# Patient Record
Sex: Male | Born: 1965 | Hispanic: Yes | Marital: Married | Smoking: Never smoker
Health system: Southern US, Community
[De-identification: ages and names within clinical notes are randomized; demographics above are authoritative.]

## PROBLEM LIST (undated history)

## (undated) DIAGNOSIS — R079 Chest pain, unspecified: Secondary | ICD-10-CM

## (undated) DIAGNOSIS — E785 Hyperlipidemia, unspecified: Secondary | ICD-10-CM

## (undated) HISTORY — DX: Hyperlipidemia, unspecified: E78.5

## (undated) HISTORY — DX: Chest pain, unspecified: R07.9

---

## 2019-04-14 ENCOUNTER — Emergency Department (HOSPITAL_BASED_OUTPATIENT_CLINIC_OR_DEPARTMENT_OTHER): Payer: PRIVATE HEALTH INSURANCE

## 2019-04-14 ENCOUNTER — Other Ambulatory Visit: Payer: Self-pay

## 2019-04-14 ENCOUNTER — Encounter (HOSPITAL_BASED_OUTPATIENT_CLINIC_OR_DEPARTMENT_OTHER): Payer: Self-pay | Admitting: *Deleted

## 2019-04-14 ENCOUNTER — Emergency Department (HOSPITAL_BASED_OUTPATIENT_CLINIC_OR_DEPARTMENT_OTHER)
Admission: EM | Admit: 2019-04-14 | Discharge: 2019-04-15 | Disposition: A | Discharge status: Home / Self Care | Payer: PRIVATE HEALTH INSURANCE | Attending: Emergency Medicine | Admitting: Emergency Medicine

## 2019-04-14 DIAGNOSIS — W230XXA Caught, crushed, jammed, or pinched between moving objects, initial encounter: Secondary | ICD-10-CM | POA: Insufficient documentation

## 2019-04-14 DIAGNOSIS — Y9289 Other specified places as the place of occurrence of the external cause: Secondary | ICD-10-CM | POA: Insufficient documentation

## 2019-04-14 DIAGNOSIS — Y9389 Activity, other specified: Secondary | ICD-10-CM | POA: Insufficient documentation

## 2019-04-14 DIAGNOSIS — S61216A Laceration without foreign body of right little finger without damage to nail, initial encounter: Secondary | ICD-10-CM | POA: Diagnosis present

## 2019-04-14 DIAGNOSIS — Z23 Encounter for immunization: Secondary | ICD-10-CM | POA: Diagnosis not present

## 2019-04-14 DIAGNOSIS — Y990 Civilian activity done for income or pay: Secondary | ICD-10-CM | POA: Diagnosis not present

## 2019-04-14 MED ORDER — ACETAMINOPHEN 500 MG PO TABS
1000.0000 mg | ORAL_TABLET | Freq: Once | ORAL | Status: AC
  Administered 2019-04-15: 1000 mg via ORAL
  Filled 2019-04-14: qty 2

## 2019-04-14 MED ORDER — MORPHINE SULFATE (PF) 4 MG/ML IV SOLN
4.0000 mg | Freq: Once | INTRAVENOUS | Status: DC
  Filled 2019-04-14: qty 1

## 2019-04-14 MED ORDER — HYDROCODONE-ACETAMINOPHEN 5-325 MG PO TABS
1.0000 | ORAL_TABLET | Freq: Once | ORAL | Status: DC
  Filled 2019-04-14: qty 1

## 2019-04-14 MED ORDER — TETANUS-DIPHTH-ACELL PERTUSSIS 5-2.5-18.5 LF-MCG/0.5 IM SUSP
0.5000 mL | Freq: Once | INTRAMUSCULAR | Status: AC
  Administered 2019-04-14: 0.5 mL via INTRAMUSCULAR
  Filled 2019-04-14: qty 0.5

## 2019-04-14 NOTE — ED Triage Notes (Signed)
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 He does not speak english. Reports right pinkie caught in a machine at work earlier this evening. Pt able to move finger, multiple lacerations/avulsions present

## 2019-04-15 MED ORDER — CEPHALEXIN 250 MG PO CAPS: 250.0000 mg | ORAL_CAPSULE | Freq: Three times a day (TID) | ORAL | 0 refills | Status: AC

## 2019-04-15 MED ORDER — LIDOCAINE HCL 2 % IJ SOLN
10.0000 mL | Freq: Once | INTRAMUSCULAR | Status: AC
  Administered 2019-04-15: 200 mg
  Filled 2019-04-15: qty 20

## 2019-04-15 MED ORDER — DICYCLOMINE HCL 10 MG PO CAPS: 10.0000 mg | ORAL_CAPSULE | Freq: Once | ORAL | Status: DC

## 2019-04-15 MED ORDER — CEPHALEXIN 250 MG PO CAPS
250.0000 mg | ORAL_CAPSULE | Freq: Once | ORAL | Status: AC
  Administered 2019-04-15: 250 mg via ORAL
  Filled 2019-04-15: qty 1

## 2019-04-15 NOTE — ED Provider Notes (Signed)
[facility] EMERGENCY DEPARTMENT Provider Note  CSN: [id number] Arrival date & time: 04/14/19 2223  Chief Complaint(s) Finger Injury  HPI Mathew Harris is a 53 y.o. male with no pertinent past medical history presents to the emergency department with right small finger injury while at work.  He reports that his finger got caught in a vacuum fan while he was trying to turn it off.  Injury occurred 2 to 3 hours prior to arrival.  He sustained laceration to the dorsum and ventral part of the small finger.  He is endorsing pain around the this area.  Pain exacerbated with palpation.  Complains of mild numbness.  No alleviating factors.  Tetanus out of date.  Denies any other injuries.  The history is provided by the patient.    Past Medical History History reviewed. No pertinent past medical history. There are no active problems to display for this patient.  Home Medication(s) Prior to Admission medications   Medication Sig Start Date End Date Taking? Authorizing Provider  cephALEXin (KEFLEX) 250 MG capsule Take 1 capsule (250 mg total) by mouth 3 (three) times daily for 5 days. 04/15/19 04/20/19  Fatima Blank, MD                                                                                                                                    Past Surgical History History reviewed. No pertinent surgical history. Family History No family history on file.  Social History Social History   Tobacco Use  . Smoking status: Never Smoker  . Smokeless tobacco: Never Used  Substance Use Topics  . Alcohol use: Never    Frequency: Never  . Drug use: Never   Allergies Penicillins  Review of Systems Review of Systems All other systems are reviewed and are negative for acute change except as noted in the HPI  Physical Exam Vital Signs  I have reviewed the triage vital signs BP 128/72 (BP Location: Left Arm)   Pulse 60   Temp 98.1 F (36.7 C) (Oral)   Resp 16    Ht 5\' 5"  (1.651 m)   Wt 94.3 kg   SpO2 98%   BMI 34.60 kg/m   Physical Exam Vitals signs reviewed.  Constitutional:      General: He is not in acute distress.    Appearance: He is well-developed. He is not diaphoretic.  HENT:     Head: Normocephalic and atraumatic.     Jaw: No trismus.     Right Ear: External ear normal.     Left Ear: External ear normal.     Nose: Nose normal.  Eyes:     General: No scleral icterus.    Conjunctiva/sclera: Conjunctivae normal.  Neck:     Musculoskeletal: Normal range of motion.     Trachea: Phonation normal.  Cardiovascular:     Rate and Rhythm: Normal rate and regular rhythm.  Pulmonary:  Effort: Pulmonary effort is normal. No respiratory distress.     Breath sounds: No stridor.  Abdominal:     General: There is no distension.  Musculoskeletal: Normal range of motion.       Hands:  Neurological:     Mental Status: He is alert and oriented to person, place, and time.  Psychiatric:        Behavior: Behavior normal.     ED Results and Treatments Labs (all labs ordered are listed, but only abnormal results are displayed) Labs Reviewed - No data to display                                                                                                                       EKG  EKG Interpretation  Date/Time:    Ventricular Rate:    PR Interval:    QRS Duration:   QT Interval:    QTC Calculation:   R Axis:     Text Interpretation:        Radiology Dg Hand Complete Right  Result Date: 04/14/2019 CLINICAL DATA:  Initial evaluation for acute injury to fifth digit. EXAM: RIGHT HAND - COMPLETE 3+ VIEW COMPARISON:  None. FINDINGS: Soft tissue irregularity about the mid right fifth digit, compatible with soft tissue injury/laceration. No radiopaque foreign body. No acute fracture dislocation. Punctate radiopaque foreign body seen within the radial soft tissues adjacent to the right third PIP joint. No other abnormality about the  hand. IMPRESSION: 1. Soft tissue injury/laceration about the mid right fifth digit. No radiopaque foreign body. 2. No acute osseous abnormality. 3. Punctate radiopaque foreign body adjacent to the right third PIP joint. Correlation with physical exam recommended. Electronically Signed   By: Rise MuBenjamin  McClintock M.D.   On: 04/14/2019 23:46    Pertinent labs & imaging results that were available during my care of the patient were reviewed by me and considered in my medical decision making (see chart for details).  Medications Ordered in ED Medications  Tdap (BOOSTRIX) injection 0.5 mL (0.5 mLs Intramuscular Given 04/14/19 2339)  acetaminophen (TYLENOL) tablet 1,000 mg (1,000 mg Oral Given 04/15/19 0001)  lidocaine (XYLOCAINE) 2 % (with pres) injection 200 mg (200 mg Other Given 04/15/19 0018)  cephALEXin (KEFLEX) capsule 250 mg (250 mg Oral Given 04/15/19 0200)  Procedures .[name].Laceration Repair  Date/Time: 04/15/2019 3:16 AM Performed by: Nira Connardama, Pedro Eduardo, MD Authorized by: Nira Connardama, Pedro Eduardo, MD   Consent:    Consent obtained:  Verbal   Consent given by:  Patient   Risks discussed:  Poor cosmetic result, poor wound healing, vascular damage, nerve damage and tendon damage   Alternatives discussed:  Delayed treatment Anesthesia (see MAR for exact dosages):    Anesthesia method:  Local infiltration   Local anesthetic:  Lidocaine 2% w/o epi Laceration details:    Location:  Finger   Finger location:  R small finger   Length (cm):  2   Depth (mm):  3 Repair type:    Repair type:  Intermediate Pre-procedure details:    Preparation:  Patient was prepped and draped in usual sterile fashion Exploration:    Hemostasis achieved with:  Direct pressure   Wound exploration: wound explored through full range of motion and entire depth of wound probed and  visualized     Wound extent: tendon damage     Wound extent: no foreign bodies/material noted, no muscle damage noted and no vascular damage noted     Tendon damage location:  Upper extremity   Upper extremity tendon damage location:  Finger extensor   Finger extensor tendon: lateral band.   Tendon damage extent:  Partial transection   Tendon repair plan:  Refer for evaluation   Contaminated: no   Treatment:    Area cleansed with:  Betadine   Amount of cleaning:  Extensive   Irrigation solution:  Sterile saline   Irrigation volume:  750cc   Irrigation method:  Pressure wash   Visualized foreign bodies/material removed: no   Skin repair:    Repair method:  Sutures   Suture size:  4-0   Wound skin closure material used: ethilon.   Suture technique:  Simple interrupted   Number of sutures:  4 Approximation:    Approximation:  Close Post-procedure details:    Dressing:  Non-adherent dressing   Patient tolerance of procedure:  Tolerated well, no immediate complications    (including critical care time)  Medical Decision Making / ED Course I have reviewed the nursing notes for this encounter and the patient's prior records (if available in EHR or on provided paperwork).   Cohen de Lawanna Kobusngel was evaluated in Emergency Department on 04/15/2019 for the symptoms described in the history of present illness. He was evaluated in the context of the global COVID-19 pandemic, which necessitated consideration that the patient might be at risk for infection with the SARS-CoV-2 virus that causes COVID-19. Institutional protocols and algorithms that pertain to the evaluation of patients at risk for COVID-19 are in a state of rapid change based on information released by regulatory bodies including the CDC and federal and state organizations. These policies and algorithms were followed during the patient's care in the ED.  Right small finger injury Neurovascularly intact Possible lateral band partial  transection Plain film without foreign body or underlying fracture.  Foreign body noted in right middle finger but no wounds or injury related to this encounter.  Likely previously embedded. Lacerations thoroughly irrigated and dorsal laceration closed as above.  Ventral laceration/skin avulsion will be allowed to close by secondary measures. Patient was provided with hand surgery contact information for further evaluation regarding the lateral band transection. Return in 10 to 14 days for suture removal. Prophylactic antibiotics given. Tetanus updated.      Final Clinical Impression(s) / ED Diagnoses Final diagnoses:  Laceration of right little finger without foreign body without damage to nail, initial encounter    The patient appears reasonably screened and/or stabilized for discharge and I doubt any other medical condition or other [facility] requiring further screening, evaluation, or treatment in the ED at this time prior to discharge.  Disposition: Discharge  Condition: Good  I have discussed the results, Dx and Tx plan with the patient who expressed understanding and agree(s) with the plan. Discharge instructions discussed at great length. The patient was given strict return precautions who verbalized understanding of the instructions. No further questions at time of discharge.    ED Discharge Orders         Ordered    cephALEXin (KEFLEX) 250 MG capsule  3 times daily     04/15/19 0151           [facility]: [facility] [address] [id number] mc [address] [id number] [phone]  For suture removal in 10-14 days  Bradly BienenstockOrtmann, Fred, MD [address] [place] [id number] [phone]   For close follow up to assess for if you cannot raise the end of your small finger as this may be related to a tendon injury  [facility] [address] [place]  [phone] Schedule an appointment as soon as possible for a visit  with Occupational Therapy for worker's compensation related injuries.      This chart was dictated using voice recognition software.  Despite best efforts to proofread,  errors can occur which can change the documentation meaning.   Nira Connardama, Pedro Eduardo, MD 04/15/19 [phone]

## 2020-07-15 ENCOUNTER — Ambulatory Visit: Payer: Self-pay | Admitting: Internal Medicine

## 2020-07-23 ENCOUNTER — Encounter: Payer: Self-pay | Admitting: Internal Medicine

## 2020-07-23 ENCOUNTER — Other Ambulatory Visit: Payer: Self-pay

## 2020-07-23 ENCOUNTER — Ambulatory Visit (INDEPENDENT_AMBULATORY_CARE_PROVIDER_SITE_OTHER): Payer: Self-pay

## 2020-07-23 ENCOUNTER — Ambulatory Visit (INDEPENDENT_AMBULATORY_CARE_PROVIDER_SITE_OTHER): Payer: Self-pay | Admitting: Internal Medicine

## 2020-07-23 VITALS — BP 110/70 | HR 61 | Temp 98.4°F | Ht 66.0 in | Wt 204.4 lb

## 2020-07-23 DIAGNOSIS — R1011 Right upper quadrant pain: Secondary | ICD-10-CM

## 2020-07-23 DIAGNOSIS — R079 Chest pain, unspecified: Secondary | ICD-10-CM

## 2020-07-23 DIAGNOSIS — R0602 Shortness of breath: Secondary | ICD-10-CM

## 2020-07-23 DIAGNOSIS — E785 Hyperlipidemia, unspecified: Secondary | ICD-10-CM | POA: Insufficient documentation

## 2020-07-23 DIAGNOSIS — E669 Obesity, unspecified: Secondary | ICD-10-CM

## 2020-07-23 NOTE — Progress Notes (Signed)
New Patient Office Visit     This visit occurred during the SARS-CoV-2 public health emergency.  Safety protocols were in place, including screening questions prior to the visit, additional usage of staff PPE, and extensive cleaning of exam room while observing appropriate contact time as indicated for disinfecting solutions.    CC/Reason for Visit: Establish care, discuss acute and chronic concerns Previous PCP: None Last Visit: Unknown  HPI: Mathew Harris is a 54 y.o. male who is coming in today for the above mentioned reasons.  He has not had medical care in years.  He does tell me that at some point he was diagnosed with hyperlipidemia and hypertriglyceridemia but he never followed the treatment.  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  No records of this are evident in epic.  He has been managing this with diet.  He notices that fatty foods and sometimes spicy foods will make it worse.  He was diagnosed with COVID-19 virus infection in December.  Ever since then he has noticed some shortness of breath that has gotten progressively worse.  He sometimes has a "smoke smell" in his nose.  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  He does not feel like this shortness of breath is worse with the work he has had to do on his land.  Over the past 6 weeks or so he has noticed occasional left-sided chest pain right under his nipple.  It is not related to exertion.  It is very sharp.  It lasts for few seconds to maybe 1 to 2 minutes and then resolves.  It is not associated to food.  Does not improve with rest.    Past Medical/Surgical History: Past Medical History:  Diagnosis Date  . Hyperlipidemia     History reviewed. No pertinent surgical history.  Social History:  reports that he has never smoked. He has never used smokeless tobacco. He reports that he does not  drink alcohol and does not use drugs.  Allergies: Allergies  Allergen Reactions  . Penicillins Other (See Comments)    As a child    Family History:  Family History  Problem Relation Age of Onset  . Alzheimer's disease Mother   . Hyperlipidemia Mother   . CAD Paternal Grandfather   . CAD Paternal Uncle   . CAD Paternal Uncle     No current outpatient medications on file.  Review of Systems:  Constitutional: Denies fever, chills, diaphoresis, appetite change and fatigue.  HEENT: Denies photophobia, eye pain, redness, hearing loss, ear pain, congestion, sore throat, rhinorrhea, sneezing, mouth sores, trouble swallowing, neck pain, neck stiffness and tinnitus.   Respiratory: Denies cough, chest tightness,  and wheezing.   Cardiovascular: Denies  palpitations and leg swelling.  Gastrointestinal: Denies nausea, vomiting, abdominal pain, diarrhea, constipation, blood in stool and abdominal distention.  Genitourinary: Denies dysuria, urgency, frequency, hematuria, flank pain and difficulty urinating.  Endocrine: Denies: hot or cold intolerance, sweats, changes in hair or nails, polyuria, polydipsia. Musculoskeletal: Denies myalgias, back pain, joint swelling, arthralgias and gait problem.  Skin: Denies pallor, rash and wound.  Neurological: Denies dizziness, seizures, syncope, weakness, light-headedness, numbness and headaches.  Hematological: Denies adenopathy. Easy bruising, personal or family bleeding history  Psychiatric/Behavioral: Denies suicidal ideation, mood changes, confusion, nervousness, sleep disturbance and agitation    Physical Exam: Vitals:   07/23/20 1301  BP: 110/70  Pulse: 61  Temp: 98.4 F (36.9 C)  TempSrc: Oral  SpO2: 96%  Weight: 204 lb 6.4 oz (92.7 kg)  Height: 5\' 6"  (1.676 m)   Body mass index is 32.99 kg/m.  Constitutional: NAD, calm, comfortable Eyes: PERRL, lids and conjunctivae normal ENMT: Mucous membranes are moist Respiratory: clear to  auscultation bilaterally, no wheezing, no crackles. Normal respiratory effort. No accessory muscle use.  Cardiovascular: Regular rate and rhythm, no murmurs / rubs / gallops. No extremity edema.  Abdomen: no tenderness, no masses palpated. No hepatosplenomegaly. Bowel sounds positive.  Neurologic: Grossly intact and nonfocal Psychiatric: Normal judgment and insight. Alert and oriented x 3. Normal mood.    Impression and Plan:  Chest pain, unspecified type/shortness of breath -Most certainly this is atypical chest pain. -He does have some CAD risk factors including mild obesity, history of hyperlipidemia as well as a strong family history with paternal grandfather and multiple paternal uncles having had MIs and fatal MIs in their 49s.  He is a never smoker.  He is normotensive, he is not diagnosed as a diabetic. -I do however feel that he has enough risk factors to warrant some cardiac work-up.  EKG has been performed in office today that shows sinus bradycardia with a rate of 59, there appears to be repolarization changes, but no acute ST or T wave changes. -I will order labs, 2D echo and a cardiology consultation. -Since he has noticed the symptoms are progressive since his COVID-19 infection in December, I also wonder about pericarditis/myocarditis post Covid.  Although EKG is not indicative of pericarditis today. -Given his shortness of breath is his main symptom, I will go ahead and order a two-view chest x-ray today.  May need to consider CT scan of his lungs pending results.  Hyperlipidemia, unspecified hyperlipidemia type  - Plan: Lipid panel  Obesity (BMI 30.0-34.9) -Discussed healthy lifestyle, including increased physical activity and better food choices to promote weight loss.  RUQ pain -Consider right upper quadrant ultrasound pending completion of cardiology work-up and results of labs ordered today.     02-05-1993, MD [facility]

## 2020-07-24 ENCOUNTER — Other Ambulatory Visit: Payer: Self-pay | Admitting: Internal Medicine

## 2020-07-24 DIAGNOSIS — E785 Hyperlipidemia, unspecified: Secondary | ICD-10-CM

## 2020-07-24 LAB — HEMOGLOBIN A1C
Hgb A1c MFr Bld: 5.5 % of total Hgb (ref <5.7)
Mean Plasma Glucose: 111 (calc)
eAG (mmol/L): 6.2 (calc)

## 2020-07-24 LAB — CBC WITH DIFFERENTIAL/PLATELET
Absolute Monocytes: 403 cells/uL (ref 200–950)
Basophils Absolute: 38 cells/uL (ref 0–200)
Basophils Relative: 0.8 %
Eosinophils Absolute: 120 cells/uL (ref 15–500)
Eosinophils Relative: 2.5 %
HCT: 45 % (ref 38.5–50.0)
Hemoglobin: 14.8 g/dL (ref 13.2–17.1)
Lymphs Abs: 1344 cells/uL (ref 850–3900)
MCH: 30.1 pg (ref 27.0–33.0)
MCHC: 32.9 g/dL (ref 32.0–36.0)
MCV: 91.6 fL (ref 80.0–100.0)
MPV: 10.8 fL (ref 7.5–12.5)
Monocytes Relative: 8.4 %
Neutro Abs: 2894 cells/uL (ref 1500–7800)
Neutrophils Relative %: 60.3 %
Platelets: 216 10*3/uL (ref 140–400)
RBC: 4.91 10*6/uL (ref 4.20–5.80)
RDW: 12.2 % (ref 11.0–15.0)
Total Lymphocyte: 28 %
WBC: 4.8 10*3/uL (ref 3.8–10.8)

## 2020-07-24 LAB — COMPREHENSIVE METABOLIC PANEL
AG Ratio: 1.8 (calc) (ref 1.0–2.5)
ALT: 21 U/L (ref 9–46)
AST: 23 U/L (ref 10–35)
Albumin: 4.8 g/dL (ref 3.6–5.1)
Alkaline phosphatase (APISO): 78 U/L (ref 35–144)
BUN: 15 mg/dL (ref 7–25)
CO2: 30 mmol/L (ref 20–32)
Calcium: 9.3 mg/dL (ref 8.6–10.3)
Chloride: 103 mmol/L (ref 98–110)
Creat: 0.94 mg/dL (ref 0.70–1.33)
Globulin: 2.6 g/dL (calc) (ref 1.9–3.7)
Glucose, Bld: 88 mg/dL (ref 65–99)
Potassium: 4.5 mmol/L (ref 3.5–5.3)
Sodium: 142 mmol/L (ref 135–146)
Total Bilirubin: 0.5 mg/dL (ref 0.2–1.2)
Total Protein: 7.4 g/dL (ref 6.1–8.1)

## 2020-07-24 LAB — LIPID PANEL
Cholesterol: 242 mg/dL — ABNORMAL HIGH (ref <200)
HDL: 39 mg/dL — ABNORMAL LOW (ref >=40)
LDL Cholesterol (Calc): 163 mg/dL (calc) — ABNORMAL HIGH
Non-HDL Cholesterol (Calc): 203 mg/dL (calc) — ABNORMAL HIGH (ref <130)
Total CHOL/HDL Ratio: 6.2 (calc) — ABNORMAL HIGH (ref <5.0)
Triglycerides: 234 mg/dL — ABNORMAL HIGH (ref <150)

## 2020-07-24 MED ORDER — ATORVASTATIN CALCIUM 20 MG PO TABS: 20.0000 mg | ORAL_TABLET | Freq: Every day | ORAL | 1 refills | Status: DC

## 2020-07-25 ENCOUNTER — Other Ambulatory Visit: Payer: Self-pay | Admitting: Internal Medicine

## 2020-07-25 DIAGNOSIS — E785 Hyperlipidemia, unspecified: Secondary | ICD-10-CM

## 2020-08-18 NOTE — Progress Notes (Signed)
Cardiology Office Note:    Date:  08/20/2020   ID:  [id number] [place], Mathew Harris [birth year], MRN [id number]  PCP:  Philip Aspen, Limmie Patricia, MD  [facility] Cardiologist:  Donato Schultz, MD  [facility] Electrophysiologist:  None   Referring MD: Philip Aspen, Estel*     History of Present Illness:    Mathew Harris is a 55 y.o. male here for evaluation of chest pain at the request of Dr. Philip Aspen.  In review of her note from 07/23/2020 he was noticing shortness of breath with COVID-19 virus in December 2020, but now he feels great.  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  Purchased a farm.  Occasionally he will notice some left-sided chest pain/pressure, sharp like pain mid axillary line. Last episode 2 weeks ago, made him scream. Woke up and got it. Quick on and off.  Happens with sitting or laying. 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   Usually it is very sharp.  Last for a less than one second.  Food does not make it worse.  No change with rest.   Family history is strong for MI with his multiple paternal uncles having fatal MIs in their 51s.  Non-smoker.    Past Medical History:  Diagnosis Date  . Hyperlipidemia     No past surgical history on file.  Current Medications: Current Meds  Medication Sig  . atorvastatin (LIPITOR) 20 MG tablet Take 1 tablet (20 mg total) by mouth daily.     Allergies:   Penicillins   Social History   Socioeconomic History  . Marital status: Married    Spouse name: Not on file  . Number of children: Not on file  . Years of education: Not on file  . Highest education level: Not on file  Occupational History  . Not on file  Tobacco Use  . Smoking status: Never Smoker  . Smokeless tobacco: Never Used  Vaping Use  . Vaping Use: Never used  Substance and Sexual Activity  . Alcohol use: Never  . Drug use: Never  . Sexual activity: Not on file  Other Topics Concern  . Not on file  Social History Narrative  . Not on file    Social Determinants of Health   Financial Resource Strain:   . Difficulty of Paying Living Expenses: Not on file  Food Insecurity:   . Worried About Programme researcher, broadcasting/film/video in the Last Year: Not on file  . Ran Out of Food in the Last Year: Not on file  Transportation Needs:   . Lack of Transportation (Medical): Not on file  . Lack of Transportation (Non-Medical): Not on file  Physical Activity:   . Days of Exercise per Week: Not on file  . Minutes of Exercise per Session: Not on file  Stress:   . Feeling of Stress : Not on file  Social Connections:   . Frequency of Communication with Friends and Family: Not on file  . Frequency of Social Gatherings with Friends and Family: Not on file  . Attends Religious Services: Not on file  . Active Member of Clubs or Organizations: Not on file  . Attends Banker Meetings: Not on file  . Marital Status: Not on file     Family History: The patient's family history includes Alzheimer's disease in his mother; CAD in his paternal grandfather, paternal uncle, and paternal uncle; Hyperlipidemia in his mother.  ROS:   Please see the history of present illness.  No fevers chills nausea vomiting syncope bleeding all other systems reviewed and are negative.  EKGs/Labs/Other Studies Reviewed:    The following studies were reviewed today: EKG, chest x-ray personally reviewed shows no abnormalities.  No evidence of rib fracture.  EKG: Sinus rhythm 59 bpm no ischemic changes.  Recent Labs: 07/23/2020: ALT 21; BUN 15; Creat 0.94; Hemoglobin 14.8; Platelets 216; Potassium 4.5; Sodium 142  Recent Lipid Panel    Component Value Date/Time   CHOL 242 (H) 07/23/2020 1343   TRIG 234 (H) 07/23/2020 1343   HDL 39 (L) 07/23/2020 1343   CHOLHDL 6.2 (H) 07/23/2020 1343   LDLCALC 163 (H) 07/23/2020 1343     Risk Assessment/Calculations:       Physical Exam:    VS:  BP 112/70   Pulse 61   Ht 5\' 6"  (1.676 m)   Wt 206 lb (93.4 kg)    SpO2 97%   BMI 33.25 kg/m     Wt Readings from Last 3 Encounters:  08/20/20 206 lb (93.4 kg)  07/23/20 204 lb 6.4 oz (92.7 kg)  04/14/19 207 lb 14.3 oz (94.3 kg)     GEN:  Well nourished, well developed in no acute distress HEENT: Normal, left V1 nerve distribution port wine stains NECK: No JVD; No carotid bruits LYMPHATICS: No lymphadenopathy CARDIAC: RRR, no murmurs, rubs, gallops RESPIRATORY:  Clear to auscultation without rales, wheezing or rhonchi  ABDOMEN: Soft, non-tender, non-distended MUSCULOSKELETAL:  No edema; No deformity  SKIN: Warm and dry, no rashes over chest pain site NEUROLOGIC:  Alert and oriented x 3 PSYCHIATRIC:  Normal affect   ASSESSMENT:    1. Atypical chest pain   2. Pure hypercholesterolemia    PLAN:    In order of problems listed above:  Chest discomfort -Fleeting sharp pinpoint left-sided chest pain.  Nonexertional.  Very atypical.  Musculoskeletal, cramping-like sensation.  Reassurance has been given. -Strong family history of CAD.  EKG showed sinus bradycardia 59 bpm, no ischemic changes personally reviewed.  Chest x-ray normal personally reviewed. -No further cardiac testing at this time.  If symptoms worsen or become more worrisome, we can always pursue further cardiac testing.  Hyperlipidemia mixed -LDL 163 triglycerides 234 from outside labs.  ALT 21.  Normal liver function. -Agree with atorvastatin 20 mg once a day.  I would definitely shoot for goal LDL of less than 100, and if necessary increase to 40 mg a day of atorvastatin high intensity dose given his family history.  This medication is crucial for his risk factor modification.  Shared Decision Making/Informed Consent        Medication Adjustments/Labs and Tests Ordered: Current medicines are reviewed at length with the patient today.  Concerns regarding medicines are outlined above.  No orders of the defined types were placed in this encounter.  No orders of the defined types  were placed in this encounter.   Patient Instructions  Medication Instructions:   Your physician recommends that you continue on your current medications as directed. Please refer to the Current Medication list given to you today.  *If you need a refill on your cardiac medications before your next appointment, please call your pharmacy*   Follow-Up:  AS NEEDED WITH DR. 06/15/19      Signed, Anne Fu, MD  08/20/2020 11:28 AM    [facility]

## 2020-08-20 ENCOUNTER — Encounter: Payer: Self-pay | Admitting: Cardiology

## 2020-08-20 ENCOUNTER — Ambulatory Visit (INDEPENDENT_AMBULATORY_CARE_PROVIDER_SITE_OTHER): Payer: Self-pay | Admitting: Cardiology

## 2020-08-20 ENCOUNTER — Other Ambulatory Visit: Payer: Self-pay

## 2020-08-20 VITALS — BP 112/70 | HR 61 | Ht 66.0 in | Wt 206.0 lb

## 2020-08-20 DIAGNOSIS — E7800 Pure hypercholesterolemia, unspecified: Secondary | ICD-10-CM

## 2020-08-20 DIAGNOSIS — R0789 Other chest pain: Secondary | ICD-10-CM

## 2020-08-20 NOTE — Patient Instructions (Signed)
Medication Instructions:   Your physician recommends that you continue on your current medications as directed. Please refer to the Current Medication list given to you today.  *If you need a refill on your cardiac medications before your next appointment, please call your pharmacy*   Follow-Up:  AS NEEDED WITH DR. SKAINS   

## 2021-01-06 ENCOUNTER — Other Ambulatory Visit: Payer: Self-pay | Admitting: Internal Medicine

## 2021-01-06 DIAGNOSIS — M25569 Pain in unspecified knee: Secondary | ICD-10-CM

## 2021-01-07 NOTE — Progress Notes (Signed)
I, Mathew Harris, LAT, ATC acting as a scribe for Mathew Graham, MD.  Subjective:    I'm seeing this patient as a consultation for Dr. [name]. Note will be routed back to referring provider/PCP.  CC: Acute left knee pain  HPI: Pt is a 55 y/o male c/o knee pain ongoing since . MOI: On Monday, pt's leg got stuck in the door and when he fell his knee bent inward/valgus stress and he fell onto the ground. Pt locates pain to the medial and posterior aspect of L knee.  He developed a rapid knee effusion following the injury.  He is able to walk reasonably well with a hinged knee brace.  L knee swelling: yes Aggravates: knee flexion- currently limited in AROM Treatments tried: cold/hot cloths, brace, creams  Past medical history, Surgical history, Family history, Social history, Allergies, and medications have been entered into the medical record, reviewed.   Review of Systems: No new headache, visual changes, nausea, vomiting, diarrhea, constipation, dizziness, abdominal pain, skin rash, fevers, chills, night sweats, weight loss, swollen lymph nodes, body aches, joint swelling, muscle aches, chest pain, shortness of breath, mood changes, visual or auditory hallucinations.   Objective:    Vitals:   01/08/21 1227  BP: 138/88  Pulse: 75  SpO2: 97%   General: Well Developed, well nourished, and in no acute distress.  Neuro/Psych: Alert and oriented x3, extra-ocular muscles intact, able to move all 4 extremities, sensation grossly intact. Skin: Warm and dry, no rashes noted.  Respiratory: Not using accessory muscles, speaking in full sentences, trachea midline.  Cardiovascular: Pulses palpable, no extremity edema. Abdomen: Does not appear distended. MSK: Left knee large knee effusion.  Normal motion without crepitation. Laxity to anterior drawer testing.  No laxity to posterior drawer testing. No laxity to MCL stress testing however some pain with this test. Minimal laxity without  pain to LCL stress testing. Guarding with McMurray's testing nondiagnostic. Intact strength.  Lab and Radiology Results  Diagnostic Limited MSK Ultrasound of: Left knee Quad tendon intact. Moderate joint effusion present. Patellar tendon intact. Medial joint line hypoechoic fluid present superior portion of the MCL however the MCL does appear to be intact Medial meniscus appears to be degenerative. LCL appears to be intact but nontender. Lateral meniscus moderately degenerative. Posterior knee no Baker's cyst Impression: Moderate joint effusion.  Grade 1 MCL strain.  Procedure: Real-time Ultrasound Guided aspiration and injection of left knee Device: Philips Affiniti 50G Images permanently stored and available for review in PACS Verbal informed consent obtained.  Discussed risks and benefits of procedure. Warned about infection bleeding damage to structures skin hypopigmentation and fat atrophy among others. Patient expresses understanding and agreement Time-out conducted.   Noted no overlying erythema, induration, or other signs of local infection.   Skin prepped in a sterile fashion.   Local anesthesia: Topical Ethyl chloride.   With sterile technique and under real time ultrasound guidance:  2 mL of lidocaine injected subcutaneously and into joint.  Skin again sterilized. 18-gauge needle used to access the joint capsule however only small amount of blood-tinged fluid aspirated. Aspiration attempt discontinued. Skin again sterilized and 22-gauge needle used to proceed with steroid injection.   40 mg of Kenalog and 2 L of Marcaine injected into knee joint fluid seen entering the joint capsule.   Completed without difficulty   Pain immediately resolved suggesting accurate placement of the medication.   Advised to call if fevers/chills, erythema, induration, drainage, or persistent bleeding.   Images  permanently stored and available for review in the ultrasound unit.  Impression:  Technically successful ultrasound guided injection.   X-ray images left knee obtained today personally independently interpreted No fractures.  Minimal degenerative changes. Await formal radiology review    Impression and Recommendations:    Assessment and Plan: 55 y.o. male with left knee twisting injury with joint effusion/hemarthrosis without fracture.  This is associated with joint laxity especially anterior drawer.  Patient very likely has an ACL tear and also likely has a grade 1 MCL strain/tear.  Although he does have a little bit of laxity to LCL stress testing I do not see an LCL tear on ultrasound and he is not painful in this stress test indicating that very likely no LCL tear.  Additionally very likely patient does have meniscus tears which may be degenerative and also some may be acute.  Discussed options with patient and his family.  He does not have insurance and I think it is unlikely that he will be able to proceed with surgery at this time.  We will manage conservatively with quad strengthening exercises, hinged knee brace, and steroid injection.    We will go ahead and proceed with application for [facility] of charity program as especially if not improving will allow [name] to proceed with MRI and possibly surgery into the future.  Recheck in 1 month or sooner if needed.[name]  PDMP not reviewed this encounter. Orders Placed This Encounter  Procedures  . [name] LIMITED JOINT SPACE STRUCTURES LOW LEFT(NO LINKED CHARGES)    Standing Status:   Future    Number of Occurrences:   1    Standing Expiration Date:   07/10/2021    Order Specific Question:   Reason for Exam (SYMPTOM  OR DIAGNOSIS REQUIRED)    Answer:   acute left knee pain    Order Specific Question:   Preferred imaging location?    Answer:   Adult nurse Sports Medicine[facility]  . DG Knee AP/LAT W/Sunrise Left    Standing Status:   Future    Number of Occurrences:   1    Standing Expiration Date:   01/08/2022    Order  Specific Question:   Reason for Exam (SYMPTOM  OR DIAGNOSIS REQUIRED)    Answer:   acute left knee pain    Order Specific Question:   Preferred imaging location?    Answer:   [name]   No orders of the defined types were placed in this encounter.   Discussed warning signs or symptoms. Please see discharge instructions. Patient expresses understanding.   The above documentation has been reviewed and is accurate and complete Mathew Harris, M.D.

## 2021-01-08 ENCOUNTER — Encounter: Payer: Self-pay | Admitting: Family Medicine

## 2021-01-08 ENCOUNTER — Ambulatory Visit (INDEPENDENT_AMBULATORY_CARE_PROVIDER_SITE_OTHER): Payer: Self-pay

## 2021-01-08 ENCOUNTER — Other Ambulatory Visit: Payer: Self-pay

## 2021-01-08 ENCOUNTER — Ambulatory Visit (INDEPENDENT_AMBULATORY_CARE_PROVIDER_SITE_OTHER): Payer: Self-pay | Admitting: Family Medicine

## 2021-01-08 ENCOUNTER — Ambulatory Visit: Payer: Self-pay

## 2021-01-08 VITALS — BP 138/88 | HR 75 | Wt 211.8 lb

## 2021-01-08 DIAGNOSIS — M25562 Pain in left knee: Secondary | ICD-10-CM

## 2021-01-08 NOTE — Patient Instructions (Addendum)
Thank you for coming in today.  Please get an Xray today before you leave  You received an injection today. Seek immediate medical attention if the joint becomes red, extremely painful, or is oozing fluid.  Apply for the charlty program.   Please complete the exercises that the athletic trainer went over with you: View at [email or web address] using code: [email or web address]  Use the brace   Recheck in 1 month.

## 2021-01-12 NOTE — Progress Notes (Signed)
No fractures are seen

## 2021-02-06 NOTE — Progress Notes (Signed)
I, Mathew Harris, LAT, ATC acting as a scribe for Mathew Graham, MD.  Mathew Harris is a 55 y.o. male who presents to [facility] today for f/u of acute L knee pain that began on 01/05/21 when his L leg got caught in a car door, forcing his L knee into a valgus position.  He was last seen by Mathew Harris on 01/08/21 and had a L knee steroid injection.  He was advised to con't wearing his L knee brace and was advised in a HEP. Since his last visit, pt reports slight improvement in pain. Pt locates pain to the medial-anterior aspect of L knee. No swelling or mechanical symptoms. Pt reports increased pain w/ valgus stress.  He rates he that he is about 80% better and still able to work.  He does not have health insurance and would like to avoid expensive tests and surgery if possible.  Currently in the process of completing his paperwork for [place] charity program.  Diagnostic testing: L knee XR- 01/08/21   Pertinent review of systems: No fevers or chills  Relevant historical information: Hyperlipidemia   Exam:  BP 110/78 (BP Location: Right Arm, Patient Position: Sitting, Cuff Size: Normal)   Pulse 66   Ht 5\' 6"  (1.676 m)   Wt 209 lb 3.2 oz (94.9 kg)   SpO2 97%   BMI 33.77 kg/m  General: Well Developed, well nourished, and in no acute distress.   MSK: Left knee normal-appearing no effusion. Normal motion. Mildly tender palpation medial joint line. No laxity or pain with MCL stress test  Slight laxity to anterior drawer testing. Positive medial McMurray's test. Intact strength.  Lab and Radiology Results  DG Knee AP/LAT W/Sunrise Left  Result Date: 01/10/2021 CLINICAL DATA:  Acute left knee pain after fall. EXAM: LEFT KNEE 3 VIEWS COMPARISON:  None. FINDINGS: No fracture or dislocation. No joint effusion. Two foci of air project superior to the patella, anterior to the distal femoral diaphysis. No other abnormalities. IMPRESSION: Two foci of soft  tissue air anterior to the distal femoral diaphysis are likely from recent trauma. Recommend clinical correlation. No fracture or effusion. Electronically Signed   By: 03/12/2021 III M.D   On: 01/10/2021 10:12   I, 03/12/2021, personally (independently) visualized and performed the interpretation of the images attached in this note.      Assessment and Plan: 55 y.o. male with left knee pain concerning for ligament or meniscus injury.  Patient has done reasonably well with conservative management.  He does not have health insurance which is a significant barrier to further care.  An MRI to further evaluate his knee is reasonable and as an obvious next step.  However assuming he has a torn meniscus or torn ACL surgery may be inaccessible.  Thankfully he is significantly improved.  Recommend hinged knee brace and continued conservative management after discussion.  If needed he will let me know and I will proceed to MRI.  Complete blood count charity program.  Recheck back as needed.    Discussed warning signs or symptoms. Please see discharge instructions. Patient expresses understanding.   The above documentation has been reviewed and is accurate and complete 40, M.D.  Visit conducted using family member a Spanish interpreter per patient preference.  Total encounter time 20 minutes including face-to-face time with the patient and, reviewing past medical record, and charting on the date of service.   Discussed treatment plan and options

## 2021-02-09 ENCOUNTER — Other Ambulatory Visit: Payer: Self-pay

## 2021-02-09 ENCOUNTER — Ambulatory Visit (INDEPENDENT_AMBULATORY_CARE_PROVIDER_SITE_OTHER): Payer: Self-pay | Admitting: Family Medicine

## 2021-02-09 VITALS — BP 110/78 | HR 66 | Ht 66.0 in | Wt 209.2 lb

## 2021-02-09 DIAGNOSIS — M25562 Pain in left knee: Secondary | ICD-10-CM

## 2021-02-09 NOTE — Patient Instructions (Signed)
Thank you for coming in today.  Continue home exercise.   If not better I can do MRI.   Research self pay MRI.   Let me know if you need anything.   We can do a shot again in the future if needed.

## 2021-02-24 ENCOUNTER — Other Ambulatory Visit: Payer: Self-pay | Admitting: Internal Medicine

## 2021-02-24 DIAGNOSIS — E785 Hyperlipidemia, unspecified: Secondary | ICD-10-CM

## 2021-07-24 ENCOUNTER — Ambulatory Visit (INDEPENDENT_AMBULATORY_CARE_PROVIDER_SITE_OTHER): Payer: Self-pay | Admitting: Internal Medicine

## 2021-07-24 ENCOUNTER — Encounter: Payer: Self-pay | Admitting: Internal Medicine

## 2021-07-24 ENCOUNTER — Other Ambulatory Visit: Payer: Self-pay

## 2021-07-24 VITALS — BP 110/70 | HR 64 | Temp 98.3°F | Ht 66.0 in | Wt 213.4 lb

## 2021-07-24 DIAGNOSIS — R350 Frequency of micturition: Secondary | ICD-10-CM

## 2021-07-24 DIAGNOSIS — E7800 Pure hypercholesterolemia, unspecified: Secondary | ICD-10-CM

## 2021-07-24 DIAGNOSIS — N400 Enlarged prostate without lower urinary tract symptoms: Secondary | ICD-10-CM | POA: Insufficient documentation

## 2021-07-24 DIAGNOSIS — N401 Enlarged prostate with lower urinary tract symptoms: Secondary | ICD-10-CM

## 2021-07-24 DIAGNOSIS — Z0000 Encounter for general adult medical examination without abnormal findings: Secondary | ICD-10-CM

## 2021-07-24 DIAGNOSIS — R519 Headache, unspecified: Secondary | ICD-10-CM

## 2021-07-24 DIAGNOSIS — Z1211 Encounter for screening for malignant neoplasm of colon: Secondary | ICD-10-CM

## 2021-07-24 DIAGNOSIS — Z23 Encounter for immunization: Secondary | ICD-10-CM

## 2021-07-24 MED ORDER — TAMSULOSIN HCL 0.4 MG PO CAPS: 0.4000 mg | ORAL_CAPSULE | Freq: Every day | ORAL | 1 refills | Status: DC

## 2021-07-24 NOTE — Patient Instructions (Signed)
-  Vuelve en ayunas para tus laboratorios.  -Para la prostata: Flomax 0.4 mg antes de acostarte.  -Para la sinusitis: mucinex 2 veces por dia y zyrtec diario.  Mathew Harris contra el shingles hoy.  -Regresa en 6 meses.

## 2021-07-24 NOTE — Progress Notes (Signed)
Established Patient Office Visit     This visit occurred during the SARS-CoV-2 public health emergency.  Safety protocols were in place, including screening questions prior to the visit, additional usage of staff PPE, and extensive cleaning of exam room while observing appropriate contact time as indicated for disinfecting solutions.    CC/Reason for Visit: Annual preventive exam  HPI: Mathew Harris is a 55 y.o. male who is coming in today for the above mentioned reasons. Past Medical History is significant for: History of hyperlipidemia on atorvastatin.  He has been compliant with medication.  Medical care is challenging due to him not being insured.  He is overdue for eye and dental care.  He is overdue for screening colonoscopy, he is overdue for many vaccines including COVID booster, shingles, flu.  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  He has not seen an ophthalmologist in years.  He has been complaining of headaches.  He has a lot of sinus/nasal congestion, gets a feeling of head fullness when bending over.  He has also been relying on red bowls for his headache.  If he skips a day of red bull headache becomes intense.  He has also been describing nocturia, urine frequency, hesitancy and a weak urine stream.   Past Medical/Surgical History: Past Medical History:  Diagnosis Date   Chest pain    Hyperlipidemia     No past surgical history on file.  Social History:  reports that he has never smoked. He has never used smokeless tobacco. He reports that he does not drink alcohol and does not use drugs.  Allergies: Allergies  Allergen Reactions   Penicillins Other (See Comments)    As a child    Family History:  Family History  Problem Relation Age of Onset   Alzheimer's disease Mother    Hyperlipidemia Mother    CAD Paternal Grandfather    CAD Paternal Uncle    CAD Paternal Uncle       Current Outpatient Medications:    atorvastatin (LIPITOR) 20 MG tablet, TAKE 1 TABLET BY MOUTH EVERY DAY, Disp: 90 tablet, Rfl: 1  Review of Systems:  Constitutional: Denies fever, chills, diaphoresis, appetite change and fatigue.  HEENT: Denies photophobia, eye pain, redness, hearing loss, ear pain, congestion, sore throat, rhinorrhea, sneezing, mouth sores, trouble swallowing, neck pain, neck stiffness and tinnitus.   Respiratory: Denies SOB, DOE, cough, chest tightness,  and wheezing.   Cardiovascular: Denies chest pain, palpitations and leg swelling.  Gastrointestinal: Denies nausea, vomiting, abdominal pain, diarrhea, constipation, blood in stool and abdominal distention.  Genitourinary: Denies dysuria, urgency, frequency, hematuria, flank pain and difficulty urinating.  Endocrine: Denies: hot or cold intolerance, sweats, changes in hair or nails, polyuria, polydipsia. Musculoskeletal: Denies myalgias, back pain, joint swelling, arthralgias and gait problem.  Skin: Denies pallor, rash and wound.  Neurological: Denies dizziness, seizures, syncope, weakness, light-headedness, numbness. Hematological: Denies adenopathy. Easy bruising, personal or family bleeding history  Psychiatric/Behavioral: Denies suicidal ideation, mood changes, confusion, nervousness, sleep disturbance and agitation    Physical Exam: Vitals:   07/24/21 1045  BP: 110/70  Pulse: 64  Temp: 98.3 F (36.8 C)  TempSrc: Oral  SpO2: 96%  Weight: 213 lb 6.4 oz (96.8 kg)  Height: 5\' 6"  (1.676 m)    Body mass index is 34.44 kg/m.   Constitutional: NAD, calm, comfortable Eyes: PERRL, lids and conjunctivae normal ENMT: Mucous membranes are  moist. Posterior pharynx clear of any exudate or lesions. Normal dentition. Tympanic membrane is pearly white, no erythema or bulging. Neck: normal, supple, no masses, no thyromegaly Respiratory: clear to auscultation bilaterally, no wheezing, no crackles. Normal  respiratory effort. No accessory muscle use.  Cardiovascular: Regular rate and rhythm, no murmurs / rubs / gallops. No extremity edema. 2+ pedal pulses. No carotid bruits.  Abdomen: no tenderness, no masses palpated. No hepatosplenomegaly. Bowel sounds positive.  Musculoskeletal: no clubbing / cyanosis. No joint deformity upper and lower extremities. Good ROM, no contractures. Normal muscle tone.  Skin: no rashes, lesions, ulcers. No induration Neurologic: CN 2-12 grossly intact. Sensation intact, DTR normal. Strength 5/5 in all 4.  Psychiatric: Normal judgment and insight. Alert and oriented x 3. Normal mood.    Impression and Plan:  Encounter for preventive health examination -Recommend routine eye and dental care. -Immunizations: Declines all vaccines today including flu and shingles despite counseling. -Healthy lifestyle discussed in detail. -Labs to be updated today. -Colon cancer screening: Declines referral despite counseling -Breast cancer screening: Not applicable -Cervical cancer screening: Not applicable -Lung cancer screening: Not applicable -Prostate cancer screening: PSA today -DEXA: Not applicable    Pure hypercholesterolemia  - Plan: CBC with Differential/Platelet, Comprehensive metabolic panel, Lipid panel, Hemoglobin A1c -Check lipid panel, continue atorvastatin.  Benign prostatic hyperplasia with urinary frequency  - Plan: PSA -Start Flomax at bedtime.  Sinus headache -I think headaches are combination of sinus headaches plus caffeine withdrawal. -We have discussed using daily antihistamines and guaifenesin to assist with sinus congestion, we have also talked about refraining from caffeinated beverages.  He has been instructed that initially he will have significant headaches but that these should with time.    Patient Instructions  -Vuelve en ayunas para tus laboratorios.  -Para la prostata: Flomax 0.4 mg antes de acostarte.  -Para la sinusitis: mucinex  2 veces por dia y zyrtec diario.  Madilyn Fireman contra el shingles hoy.  -Regresa en 6 meses.      Chaya Jan, MD [facility]

## 2021-07-28 ENCOUNTER — Other Ambulatory Visit (INDEPENDENT_AMBULATORY_CARE_PROVIDER_SITE_OTHER): Payer: Self-pay

## 2021-07-28 ENCOUNTER — Other Ambulatory Visit: Payer: Self-pay

## 2021-07-28 DIAGNOSIS — E7800 Pure hypercholesterolemia, unspecified: Secondary | ICD-10-CM

## 2021-07-28 DIAGNOSIS — R350 Frequency of micturition: Secondary | ICD-10-CM

## 2021-07-28 DIAGNOSIS — N401 Enlarged prostate with lower urinary tract symptoms: Secondary | ICD-10-CM

## 2021-07-28 LAB — CBC WITH DIFFERENTIAL/PLATELET
Basophils Absolute: 0 10*3/uL (ref 0.0–0.1)
Basophils Relative: 0.6 % (ref 0.0–3.0)
Eosinophils Absolute: 0.2 10*3/uL (ref 0.0–0.7)
Eosinophils Relative: 3.9 % (ref 0.0–5.0)
HCT: 41.8 % (ref 39.0–52.0)
Hemoglobin: 14 g/dL (ref 13.0–17.0)
Lymphocytes Relative: 27.4 % (ref 12.0–46.0)
Lymphs Abs: 1.2 10*3/uL (ref 0.7–4.0)
MCHC: 33.5 g/dL (ref 30.0–36.0)
MCV: 89.7 fl (ref 78.0–100.0)
Monocytes Absolute: 0.4 10*3/uL (ref 0.1–1.0)
Monocytes Relative: 9.1 % (ref 3.0–12.0)
Neutro Abs: 2.6 10*3/uL (ref 1.4–7.7)
Neutrophils Relative %: 59 % (ref 43.0–77.0)
Platelets: 182 10*3/uL (ref 150.0–400.0)
RBC: 4.66 Mil/uL (ref 4.22–5.81)
RDW: 13.4 % (ref 11.5–15.5)
WBC: 4.5 10*3/uL (ref 4.0–10.5)

## 2021-07-28 LAB — COMPREHENSIVE METABOLIC PANEL
ALT: 26 U/L (ref 0–53)
AST: 39 U/L — ABNORMAL HIGH (ref 0–37)
Albumin: 4.5 g/dL (ref 3.5–5.2)
Alkaline Phosphatase: 71 U/L (ref 39–117)
BUN: 15 mg/dL (ref 6–23)
CO2: 27 mEq/L (ref 19–32)
Calcium: 8.9 mg/dL (ref 8.4–10.5)
Chloride: 104 mEq/L (ref 96–112)
Creatinine, Ser: 0.92 mg/dL (ref 0.40–1.50)
GFR: 94.48 mL/min (ref >60.00)
Glucose, Bld: 99 mg/dL (ref 70–99)
Potassium: 3.9 mEq/L (ref 3.5–5.1)
Sodium: 140 mEq/L (ref 135–145)
Total Bilirubin: 0.6 mg/dL (ref 0.2–1.2)
Total Protein: 6.9 g/dL (ref 6.0–8.3)

## 2021-07-28 LAB — LIPID PANEL
Cholesterol: 153 mg/dL (ref 0–200)
HDL: 35 mg/dL — ABNORMAL LOW (ref >39.00)
LDL Cholesterol: 93 mg/dL (ref 0–99)
NonHDL: 117.89
Total CHOL/HDL Ratio: 4
Triglycerides: 126 mg/dL (ref 0.0–149.0)
VLDL: 25.2 mg/dL (ref 0.0–40.0)

## 2021-07-28 LAB — PSA: PSA: 0.42 ng/mL (ref 0.10–4.00)

## 2021-07-28 LAB — HEMOGLOBIN A1C: Hgb A1c MFr Bld: 5.5 % (ref 4.6–6.5)

## 2022-04-13 ENCOUNTER — Other Ambulatory Visit: Payer: Self-pay | Admitting: Internal Medicine

## 2022-04-13 DIAGNOSIS — L03116 Cellulitis of left lower limb: Secondary | ICD-10-CM

## 2022-04-13 MED ORDER — DOXYCYCLINE HYCLATE 100 MG PO TABS: 100.0000 mg | ORAL_TABLET | Freq: Two times a day (BID) | ORAL | 0 refills | Status: AC

## 2022-06-10 ENCOUNTER — Encounter: Payer: Self-pay | Admitting: Internal Medicine

## 2022-06-10 ENCOUNTER — Ambulatory Visit (INDEPENDENT_AMBULATORY_CARE_PROVIDER_SITE_OTHER): Payer: Self-pay | Admitting: Internal Medicine

## 2022-06-10 VITALS — BP 120/78 | HR 70 | Temp 97.9°F | Wt 217.3 lb

## 2022-06-10 DIAGNOSIS — Z23 Encounter for immunization: Secondary | ICD-10-CM

## 2022-06-10 DIAGNOSIS — G8929 Other chronic pain: Secondary | ICD-10-CM

## 2022-06-10 DIAGNOSIS — K219 Gastro-esophageal reflux disease without esophagitis: Secondary | ICD-10-CM

## 2022-06-10 DIAGNOSIS — M25561 Pain in right knee: Secondary | ICD-10-CM

## 2022-06-10 DIAGNOSIS — N401 Enlarged prostate with lower urinary tract symptoms: Secondary | ICD-10-CM

## 2022-06-10 DIAGNOSIS — R351 Nocturia: Secondary | ICD-10-CM

## 2022-06-10 DIAGNOSIS — R3589 Other polyuria: Secondary | ICD-10-CM

## 2022-06-10 DIAGNOSIS — R350 Frequency of micturition: Secondary | ICD-10-CM

## 2022-06-10 DIAGNOSIS — E785 Hyperlipidemia, unspecified: Secondary | ICD-10-CM

## 2022-06-10 DIAGNOSIS — M25562 Pain in left knee: Secondary | ICD-10-CM

## 2022-06-10 DIAGNOSIS — E7800 Pure hypercholesterolemia, unspecified: Secondary | ICD-10-CM

## 2022-06-10 LAB — POCT URINALYSIS DIPSTICK
Bilirubin, UA: POSITIVE
Blood, UA: NEGATIVE
Glucose, UA: NEGATIVE
Ketones, UA: NEGATIVE
Leukocytes, UA: NEGATIVE
Nitrite, UA: NEGATIVE
Protein, UA: POSITIVE — AB
Spec Grav, UA: 1.025 (ref 1.010–1.025)
Urobilinogen, UA: 0.2 E.U./dL
pH, UA: 5.5 (ref 5.0–8.0)

## 2022-06-10 LAB — POCT GLYCOSYLATED HEMOGLOBIN (HGB A1C): Hemoglobin A1C: 5.5 % (ref 4.0–5.6)

## 2022-06-10 MED ORDER — ATORVASTATIN CALCIUM 20 MG PO TABS: 20.0000 mg | ORAL_TABLET | Freq: Every day | ORAL | 1 refills | Status: DC

## 2022-06-10 MED ORDER — TAMSULOSIN HCL 0.4 MG PO CAPS: 0.4000 mg | ORAL_CAPSULE | Freq: Every day | ORAL | 1 refills | Status: DC

## 2022-06-10 MED ORDER — PANTOPRAZOLE SODIUM 40 MG PO TBEC: 40.0000 mg | DELAYED_RELEASE_TABLET | Freq: Every day | ORAL | 1 refills | Status: DC

## 2022-06-10 NOTE — Progress Notes (Signed)
Established Patient Office Visit     CC/Reason for Visit: Discuss acute and chronic concerns  HPI: Mathew Harris is a 56 y.o. male who is coming in today for the above mentioned reasons.  He is in today to discuss some issues:  1.  He is known to have BPH.  He quit taking his Flomax a few months back for unknown reasons.  He is having increased frequency, nocturia, urgency.  No dysuria or pain.  2.  He is having significant epigastric and center of the chest burning pain with frequent belching.  He drinks a lot of red bull on a daily basis.  He has had to cut down on tomato-based products and spicy foods as this makes it worse.  3.  He quit taking his atorvastatin for unknown reasons and is wanting it refilled.  4.  He is having bilateral knee pain.  Pain is right above the knee joint and is worse with extension and flexion of the knee.  Past Medical/Surgical History: Past Medical History:  Diagnosis Date   Chest pain    Hyperlipidemia     No past surgical history on file.  Social History:  reports that he has never smoked. He has never used smokeless tobacco. He reports that he does not drink alcohol and does not use drugs.  Allergies: Allergies  Allergen Reactions   Penicillins Other (See Comments)    As a child    Family History:  Family History  Problem Relation Age of Onset   Alzheimer's disease Mother    Hyperlipidemia Mother    CAD Paternal Grandfather    CAD Paternal Uncle    CAD Paternal Uncle      Current Outpatient Medications:    pantoprazole (PROTONIX) 40 MG tablet, Take 1 tablet (40 mg total) by mouth daily., Disp: 90 tablet, Rfl: 1   atorvastatin (LIPITOR) 20 MG tablet, Take 1 tablet (20 mg total) by mouth daily., Disp: 90 tablet, Rfl: 1   tamsulosin (FLOMAX) 0.4 MG CAPS capsule, Take 1 capsule (0.4 mg total) by mouth daily., Disp: 90 capsule, Rfl: 1  Review of Systems:  Constitutional: Denies fever, chills, diaphoresis, appetite change  and fatigue.  HEENT: Denies photophobia, eye pain, redness, hearing loss, ear pain, congestion, sore throat, rhinorrhea, sneezing, mouth sores, trouble swallowing, neck pain, neck stiffness and tinnitus.   Respiratory: Denies SOB, DOE, cough, chest tightness,  and wheezing.   Cardiovascular: Denies chest pain, palpitations and leg swelling.  Gastrointestinal: Denies  vomiting,  diarrhea, constipation, blood in stool and abdominal distention.  Genitourinary: Denies dysuria,  hematuria, flank pain. Endocrine: Denies: hot or cold intolerance, sweats, changes in hair or nails, polyuria, polydipsia. Musculoskeletal: Denies myalgias, back pain, joint swelling, arthralgias and gait problem.  Skin: Denies pallor, rash and wound.  Neurological: Denies dizziness, seizures, syncope, weakness, light-headedness, numbness and headaches.  Hematological: Denies adenopathy. Easy bruising, personal or family bleeding history  Psychiatric/Behavioral: Denies suicidal ideation, mood changes, confusion, nervousness, sleep disturbance and agitation    Physical Exam: Vitals:   06/10/22 0736  BP: 120/78  Pulse: 70  Temp: 97.9 F (36.6 C)  TempSrc: Oral  SpO2: 100%  Weight: 217 lb 4.8 oz (98.6 kg)    Body mass index is 35.07 kg/m.   Constitutional: NAD, calm, comfortable Eyes: PERRL, lids and conjunctivae normal ENMT: Mucous membranes are moist. Respiratory: clear to auscultation bilaterally, no wheezing, no crackles. Normal respiratory effort. No accessory muscle use.  Cardiovascular: Regular rate and rhythm,  no murmurs / rubs / gallops. No extremity edema. Neurologic: CN 2-12 grossly intact. Sensation intact, DTR normal. Strength 5/5 in all 4.  Psychiatric: Normal judgment and insight. Alert and oriented x 3. Normal mood.    Impression and Plan:  Benign prostatic hyperplasia with urinary frequency  - Plan: tamsulosin (FLOMAX) 0.4 MG CAPS capsule -If no improvement consider GU  referral.  Gastroesophageal reflux disease, unspecified whether esophagitis present  - Plan: pantoprazole (PROTONIX) 40 MG tablet -This appears to be the most likely diagnosis.  Start PPI, consider GI referral if no improvement.  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  Have printed out some exercises that he might find useful.  Hyperlipidemia, unspecified hyperlipidemia type  - Plan: atorvastatin (LIPITOR) 20 MG tablet -Check fasting lipids in 3 months.  Need for shingles vaccine  - Plan: Zoster Recombinant (Shingrix )    Time spent:30 minutes reviewing chart, interviewing and examining patient and formulating plan of care.    Chaya Jan, MD [facility] at [facility]

## 2022-07-06 ENCOUNTER — Telehealth: Payer: Self-pay | Admitting: Internal Medicine

## 2022-07-06 DIAGNOSIS — R3589 Other polyuria: Secondary | ICD-10-CM

## 2022-07-06 DIAGNOSIS — N401 Enlarged prostate with lower urinary tract symptoms: Secondary | ICD-10-CM

## 2022-07-06 NOTE — Telephone Encounter (Signed)
Pt daughter Wilhemena Durie is calling and would like to proceed with getting the referral to urologist dx polyuria

## 2022-07-06 NOTE — Telephone Encounter (Signed)
Okay to place a referral?

## 2022-07-06 NOTE — Telephone Encounter (Signed)
Referral placed.

## 2022-08-03 IMAGING — DX DG KNEE AP/LAT W/ SUNRISE*L*
3 series · 3 of 3 positions shown · non-contrast
Comparison: None.

CLINICAL DATA: Acute left knee pain after fall.

EXAM:
LEFT KNEE 3 VIEWS

[knee ap]
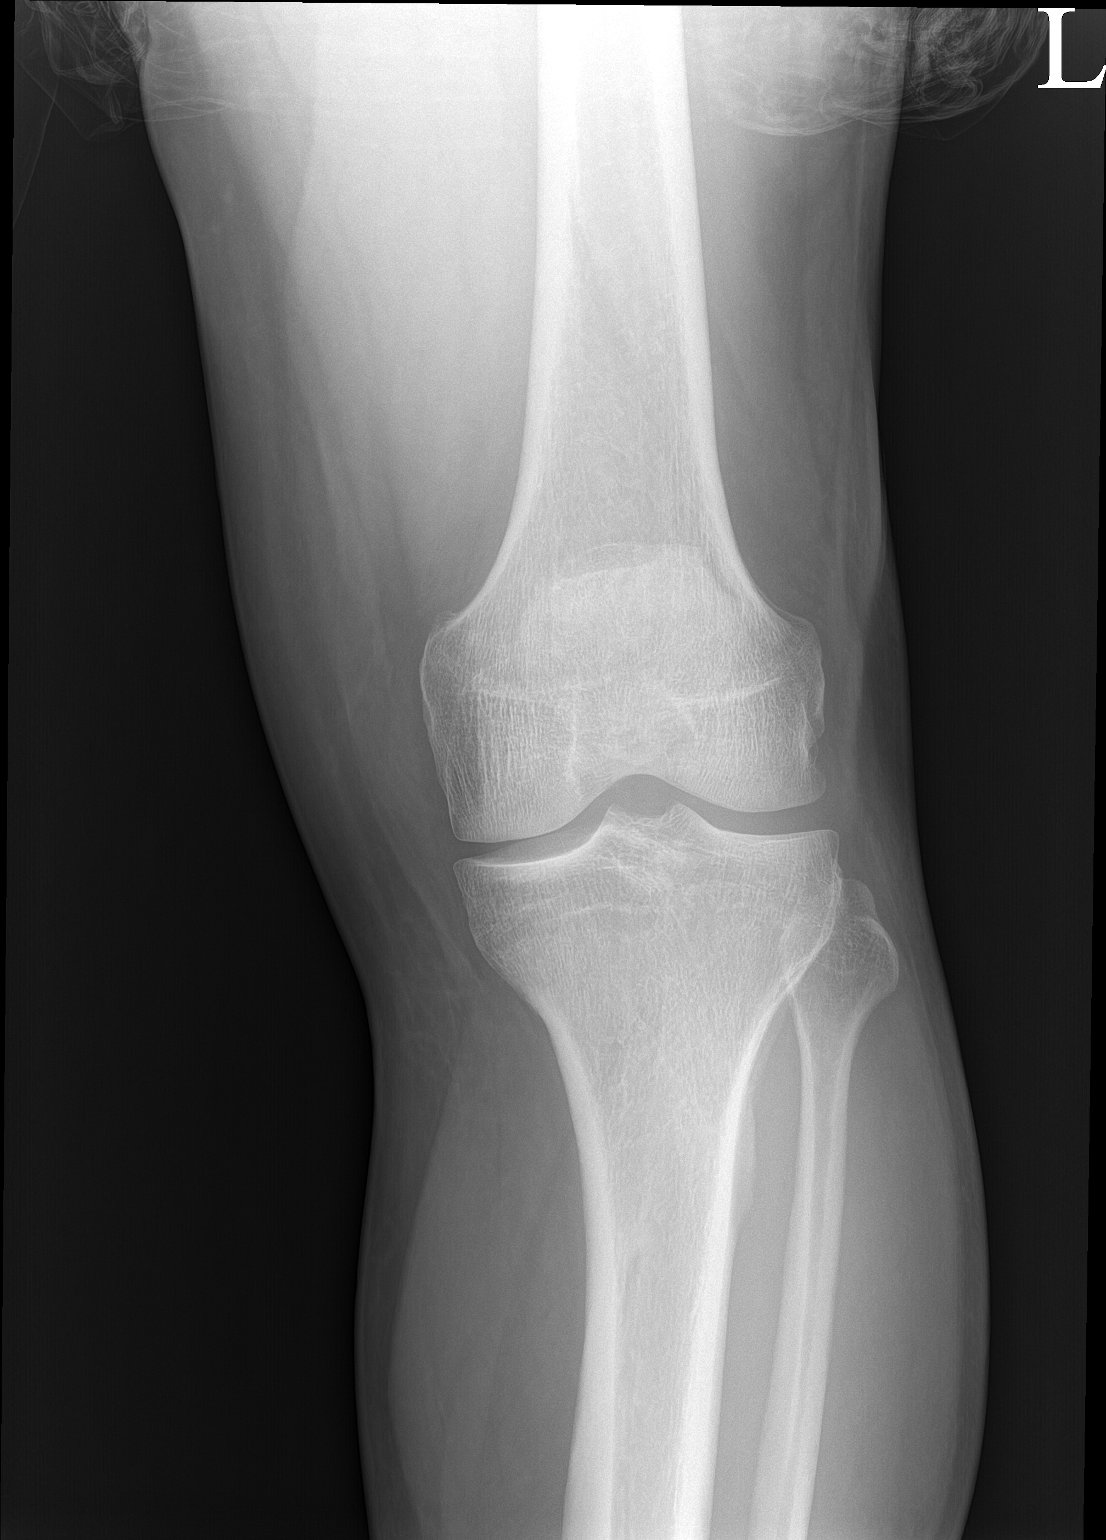

[knee lat]
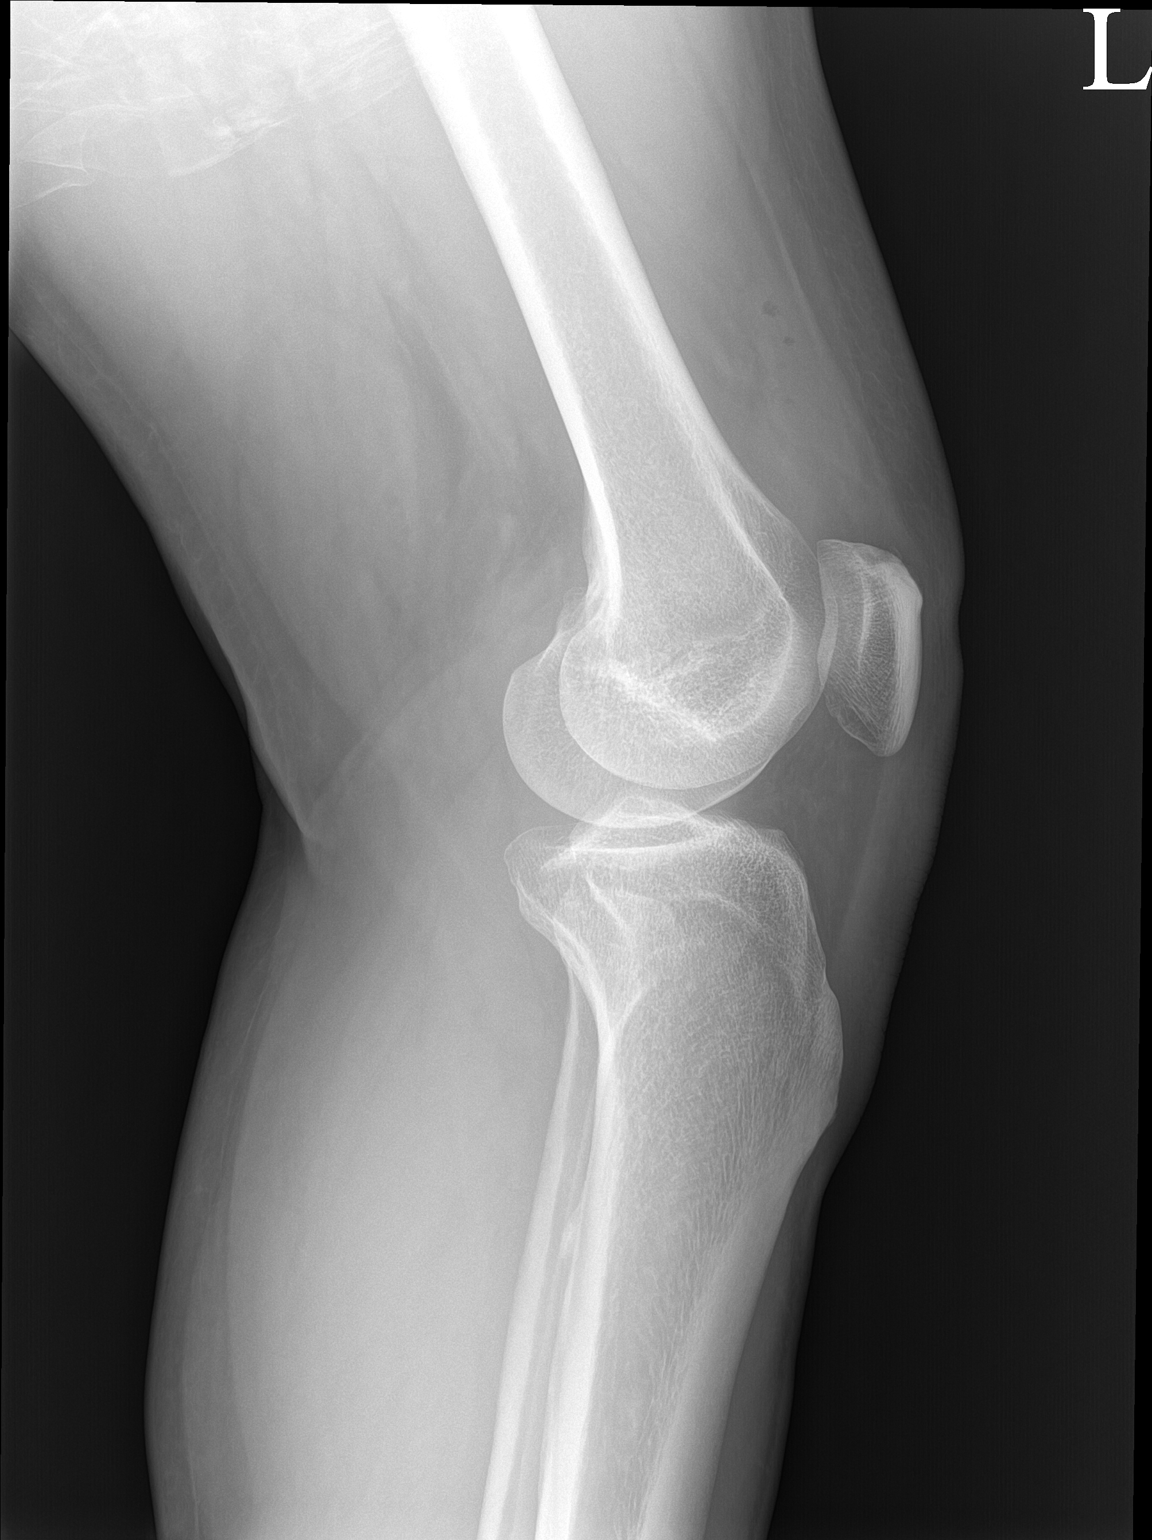

[patella]
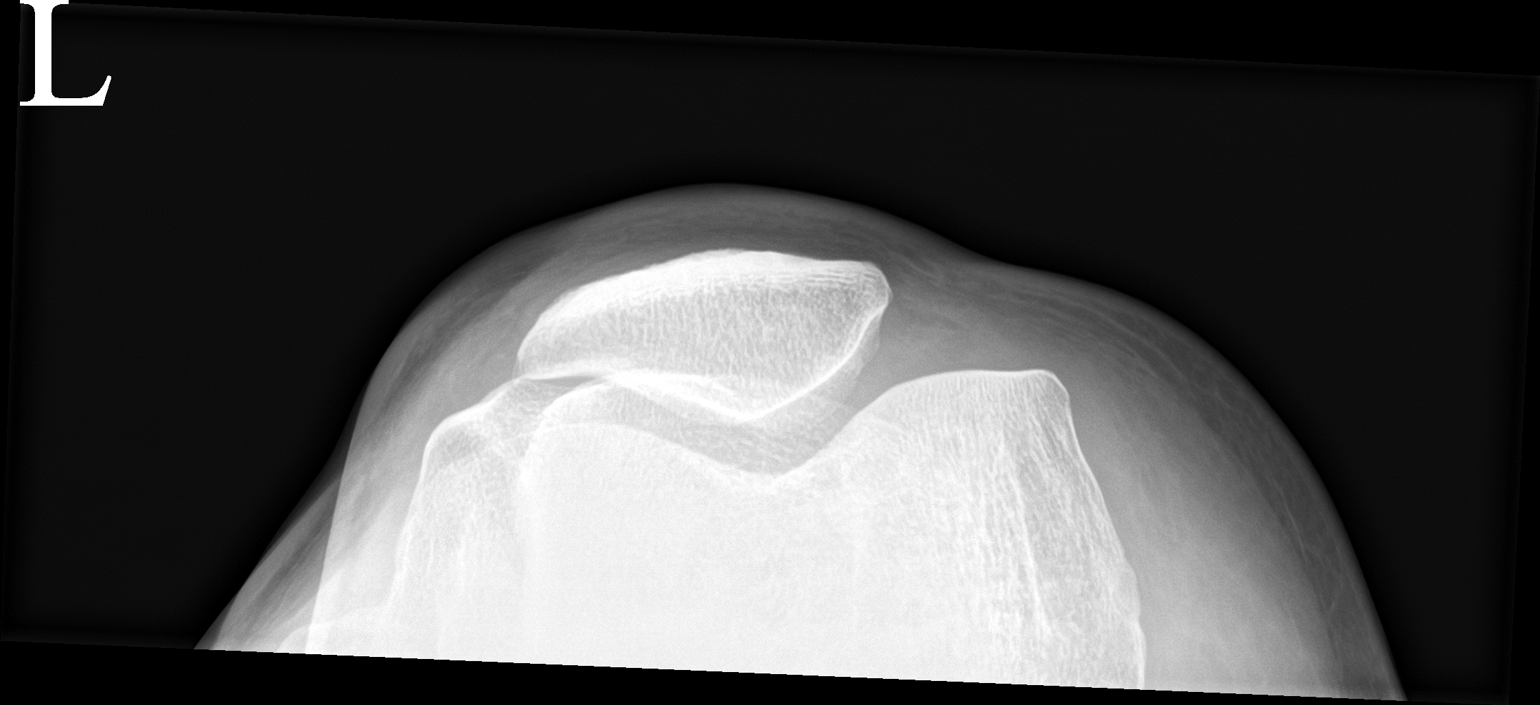

[3 of 3 positions shown; findings below may reference images not displayed]

FINDINGS: No fracture or dislocation. No joint effusion. Two foci of air
project superior to the patella, anterior to the distal femoral
diaphysis. No other abnormalities.
IMPRESSION: Two foci of soft tissue air anterior to the distal femoral diaphysis
are likely from recent trauma. Recommend clinical correlation. No
fracture or effusion.

## 2022-09-09 ENCOUNTER — Encounter: Payer: Self-pay | Admitting: Internal Medicine

## 2022-11-04 ENCOUNTER — Encounter: Payer: Self-pay | Admitting: Internal Medicine

## 2022-11-08 ENCOUNTER — Ambulatory Visit (INDEPENDENT_AMBULATORY_CARE_PROVIDER_SITE_OTHER): Payer: 59 | Admitting: Internal Medicine

## 2022-11-08 VITALS — BP 110/80 | HR 76 | Temp 98.1°F | Ht 67.0 in | Wt 220.3 lb

## 2022-11-08 DIAGNOSIS — Z1211 Encounter for screening for malignant neoplasm of colon: Secondary | ICD-10-CM | POA: Diagnosis not present

## 2022-11-08 DIAGNOSIS — Z23 Encounter for immunization: Secondary | ICD-10-CM | POA: Diagnosis not present

## 2022-11-08 DIAGNOSIS — K219 Gastro-esophageal reflux disease without esophagitis: Secondary | ICD-10-CM | POA: Diagnosis not present

## 2022-11-08 DIAGNOSIS — Z0000 Encounter for general adult medical examination without abnormal findings: Secondary | ICD-10-CM

## 2022-11-08 MED ORDER — PANTOPRAZOLE SODIUM 40 MG PO TBEC: 40.0000 mg | DELAYED_RELEASE_TABLET | Freq: Every day | ORAL | 1 refills | Status: DC

## 2022-11-08 NOTE — Addendum Note (Signed)
Addended by: Westley Hummer B on: 11/08/2022 03:07 PM   Modules accepted: Orders

## 2022-11-08 NOTE — Progress Notes (Signed)
Established Patient Office Visit     CC/Reason for Visit: Annual preventive exam  HPI: Mathew Harris is a 57 y.o. male who is coming in today for the above mentioned reasons. Past Medical History is significant for: BPH, hyperlipidemia, GERD.  He is overdue for flu, COVID, second shingles vaccine.  He has never had a screening colonoscopy.  He has routine eye and dental care.   Past Medical/Surgical History: Past Medical History:  Diagnosis Date   Chest pain    Hyperlipidemia     No past surgical history on file.  Social History:  reports that he has never smoked. He has never used smokeless tobacco. He reports that he does not drink alcohol and does not use drugs.  Allergies: Allergies  Allergen Reactions   Penicillins Other (See Comments)    As a child    Family History:  Family History  Problem Relation Age of Onset   Alzheimer's disease Mother    Hyperlipidemia Mother    CAD Paternal Grandfather    CAD Paternal Uncle    CAD Paternal Uncle      Current Outpatient Medications:    atorvastatin (LIPITOR) 20 MG tablet, Take 1 tablet (20 mg total) by mouth daily., Disp: 90 tablet, Rfl: 1   silodosin (RAPAFLO) 8 MG CAPS capsule, Take 8 mg by mouth daily., Disp: , Rfl:    pantoprazole (PROTONIX) 40 MG tablet, Take 1 tablet (40 mg total) by mouth daily., Disp: 90 tablet, Rfl: 1  Review of Systems:  Negative unless indicated in HPI.   Physical Exam: Vitals:   11/08/22 1406  BP: 110/80  Pulse: 76  Temp: 98.1 F (36.7 C)  TempSrc: Oral  SpO2: 96%  Weight: 220 lb 4.8 oz (99.9 kg)  Height: 5\' 7"  (1.702 m)    Body mass index is 34.5 kg/m.   Physical Exam Vitals reviewed.  Constitutional:      General: He is not in acute distress.    Appearance: Normal appearance. He is not ill-appearing, toxic-appearing or diaphoretic.  HENT:     Head: Normocephalic.     Right Ear: Tympanic membrane, ear canal and external ear normal. There is no impacted  cerumen.     Left Ear: Tympanic membrane, ear canal and external ear normal. There is no impacted cerumen.     Nose: Nose normal.     Mouth/Throat:     Mouth: Mucous membranes are moist.     Pharynx: Oropharynx is clear. No oropharyngeal exudate or posterior oropharyngeal erythema.  Eyes:     General: No scleral icterus.       Right eye: No discharge.        Left eye: No discharge.     Conjunctiva/sclera: Conjunctivae normal.     Pupils: Pupils are equal, round, and reactive to light.  Neck:     Vascular: No carotid bruit.  Cardiovascular:     Rate and Rhythm: Normal rate and regular rhythm.     Pulses: Normal pulses.     Heart sounds: Normal heart sounds.  Pulmonary:     Effort: Pulmonary effort is normal. No respiratory distress.     Breath sounds: Normal breath sounds.  Abdominal:     General: Abdomen is flat. Bowel sounds are normal.     Palpations: Abdomen is soft.  Musculoskeletal:        General: Normal range of motion.     Cervical back: Normal range of motion.  Skin:  General: Skin is warm and dry.     Capillary Refill: Capillary refill takes less than 2 seconds.  Neurological:     General: No focal deficit present.     Mental Status: He is alert and oriented to person, place, and time. Mental status is at baseline.  Psychiatric:        Mood and Affect: Mood normal.        Behavior: Behavior normal.        Thought Content: Thought content normal.        Judgment: Judgment normal.     [place] Office Visit from 11/08/2022 in [place] [facility] [facility] [place]  PHQ-9 Total Score 0        Impression and Plan:  Encounter for preventive health examination - Plan: CBC with Differential/Platelet, Comprehensive metabolic panel, Hemoglobin A1c, Lipid panel, PSA, Vitamin B12  Gastroesophageal reflux disease, unspecified whether esophagitis present - Plan: pantoprazole (PROTONIX) 40 MG tablet  Screening for colon cancer  Immunization  due  -Recommend routine eye and dental care. -Healthy lifestyle discussed in detail. -Labs to be updated today. -Prostate cancer screening: PSA requested Health Maintenance  Topic Date Due   HIV Screening  Never done   Hepatitis C Screening: USPSTF Recommendation to screen - Ages 42-79 yo.  Never done   Zoster (Shingles) Vaccine (2 of 2) 08/05/2022   COVID-19 Vaccine (3 - 2023-24 season) 11/24/2022*   Flu Shot  01/02/2023*   Colon Cancer Screening  11/09/2023*   DTaP/Tdap/Td vaccine (3 - Td or Tdap) 04/13/2032   HPV Vaccine  Aged Out  *Topic was postponed. The date shown is not the original due date.    -GI referral placed for screening colonoscopy.      Lelon Frohlich, MD [facility] [place]

## 2022-11-09 ENCOUNTER — Other Ambulatory Visit: Payer: 59

## 2022-11-10 ENCOUNTER — Other Ambulatory Visit: Payer: 59

## 2022-12-07 ENCOUNTER — Other Ambulatory Visit: Payer: Self-pay | Admitting: Internal Medicine

## 2022-12-07 DIAGNOSIS — K219 Gastro-esophageal reflux disease without esophagitis: Secondary | ICD-10-CM

## 2022-12-22 ENCOUNTER — Telehealth: Payer: Self-pay | Admitting: Internal Medicine

## 2022-12-22 NOTE — Telephone Encounter (Signed)
Pt daughter is calling and she has bottle in her hand and its said 40 mg of lansoprazole not 30 mg  (PREVACID) 30 MG capsule . Please confirm

## 2022-12-23 NOTE — Telephone Encounter (Signed)
Left message on machine via interpretor Evelyn.  Patient to call back if needed.

## 2023-01-17 ENCOUNTER — Encounter: Payer: 59 | Admitting: Gastroenterology

## 2023-06-13 ENCOUNTER — Encounter: Payer: Self-pay | Admitting: Internal Medicine

## 2023-06-25 ENCOUNTER — Other Ambulatory Visit: Payer: Self-pay | Admitting: Internal Medicine

## 2023-06-25 DIAGNOSIS — E785 Hyperlipidemia, unspecified: Secondary | ICD-10-CM

## 2023-06-25 DIAGNOSIS — K219 Gastro-esophageal reflux disease without esophagitis: Secondary | ICD-10-CM

## 2023-10-20 ENCOUNTER — Other Ambulatory Visit: Payer: Self-pay | Admitting: Internal Medicine

## 2023-10-20 DIAGNOSIS — K219 Gastro-esophageal reflux disease without esophagitis: Secondary | ICD-10-CM

## 2023-11-04 ENCOUNTER — Other Ambulatory Visit (HOSPITAL_COMMUNITY): Payer: Self-pay

## 2023-11-04 ENCOUNTER — Telehealth: Payer: Self-pay

## 2023-11-04 NOTE — Telephone Encounter (Signed)
*  Primary  Pending new insurance information. [facility] will not pull any information for patient.

## 2023-11-04 NOTE — Telephone Encounter (Signed)
New insurance card needed. Pharmacy benefit information on most recent card on file has been terminated.

## 2023-11-08 NOTE — Telephone Encounter (Signed)
Left message on machine with the aid of Tia the interpreter for the patient to return our call.

## 2023-11-09 NOTE — Telephone Encounter (Signed)
 Left message on machine for patient to return our call with the aid of interpreter Cato Cockayne.

## 2023-11-15 MED ORDER — LANSOPRAZOLE 30 MG PO CPDR: 30.0000 mg | DELAYED_RELEASE_CAPSULE | Freq: Every day | ORAL | 1 refills | Status: AC

## 2023-11-15 NOTE — Telephone Encounter (Signed)
Talked to the daughter and the patient is self pay.  Rx sent.
